# Patient Record
Sex: Male | Born: 2000 | Race: White | Hispanic: No | Marital: Single
Health system: Southern US, Community
[De-identification: ages and names within clinical notes are randomized; demographics above are authoritative.]

## PROBLEM LIST (undated history)

## (undated) DIAGNOSIS — T4145XA Adverse effect of unspecified anesthetic, initial encounter: Secondary | ICD-10-CM

## (undated) DIAGNOSIS — T8859XA Other complications of anesthesia, initial encounter: Secondary | ICD-10-CM

## (undated) DIAGNOSIS — L729 Follicular cyst of the skin and subcutaneous tissue, unspecified: Secondary | ICD-10-CM

## (undated) HISTORY — PX: TYMPANOSTOMY TUBE PLACEMENT: SHX32

---
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
## 2022-02-22 ENCOUNTER — Emergency Department: Admission: EM | Admit: 2022-02-22 | Discharge: 2022-02-22 | Discharge status: Against Medical Advice | Payer: Self-pay
